# Patient Record
Sex: Female | Born: 1995 | Race: White | Hispanic: No | Marital: Single | Smoking: Former smoker
Health system: Southern US, Community
[De-identification: ages and names within clinical notes are randomized; demographics above are authoritative.]

## PROBLEM LIST (undated history)

## (undated) HISTORY — PX: OTHER SURGICAL HISTORY: SHX169

---

## 2021-04-29 ENCOUNTER — Emergency Department: Payer: No Typology Code available for payment source

## 2021-04-29 ENCOUNTER — Emergency Department
Admission: EM | Admit: 2021-04-29 | Discharge: 2021-04-29 | Disposition: A | Discharge status: Home / Self Care | Payer: No Typology Code available for payment source | Attending: Emergency Medicine | Admitting: Emergency Medicine

## 2021-04-29 ENCOUNTER — Other Ambulatory Visit: Payer: Self-pay

## 2021-04-29 DIAGNOSIS — O200 Threatened abortion: Secondary | ICD-10-CM | POA: Insufficient documentation

## 2021-04-29 DIAGNOSIS — Z87891 Personal history of nicotine dependence: Secondary | ICD-10-CM | POA: Diagnosis not present

## 2021-04-29 DIAGNOSIS — M7918 Myalgia, other site: Secondary | ICD-10-CM

## 2021-04-29 DIAGNOSIS — M25511 Pain in right shoulder: Secondary | ICD-10-CM | POA: Insufficient documentation

## 2021-04-29 DIAGNOSIS — Z9181 History of falling: Secondary | ICD-10-CM

## 2021-04-29 DIAGNOSIS — Z3491 Encounter for supervision of normal pregnancy, unspecified, first trimester: Secondary | ICD-10-CM

## 2021-04-29 DIAGNOSIS — M542 Cervicalgia: Secondary | ICD-10-CM | POA: Diagnosis not present

## 2021-04-29 DIAGNOSIS — W208XXA Other cause of strike by thrown, projected or falling object, initial encounter: Secondary | ICD-10-CM | POA: Diagnosis not present

## 2021-04-29 DIAGNOSIS — Z3A01 Less than 8 weeks gestation of pregnancy: Secondary | ICD-10-CM | POA: Insufficient documentation

## 2021-04-29 DIAGNOSIS — R109 Unspecified abdominal pain: Secondary | ICD-10-CM

## 2021-04-29 LAB — HCG, QUANTITATIVE, PREGNANCY: hCG, Beta Chain, Quant, S: 28479 m[IU]/mL — ABNORMAL HIGH (ref <5)

## 2021-04-29 NOTE — ED Triage Notes (Signed)
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 Pt c/o neck pain . Pt states she is also [redacted] weeks pregnant.

## 2021-04-29 NOTE — ED Provider Notes (Signed)
Early pregnancy  [facility] Emergency Department Provider Note   ____________________________________________   Event Date/Time   First MD Initiated Contact with Patient 04/29/21 [phone]     (approximate)  I have reviewed the triage vital signs and the nursing notes.   HISTORY  Chief Complaint Neck Pain    HPI  Carmen Yoder is a 25 y.o. female patient complain of posterior neck and right shoulder pain secondary to being hit by a cargo door causing her to fall.  Patient also was concerned due to early pregnancy.  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  Patient denies pelvic pain or active bleeding at this time.  Patient rates her pain as a 5/10.  Patient described pain to her neck and shoulder as "achy".  No palliative measure for complaint.  Patient states she is approximate [redacted] weeks gestation.         History reviewed. No pertinent past medical history.  There are no problems to display for this patient.   Past Surgical History:  Procedure Laterality Date   lypoma removal      Prior to Admission medications   Not on File    Allergies Patient has no known allergies.  No family history on file.  Social History Social History   Tobacco Use   Smoking status: Former    Pack years: 0.00    Types: Cigarettes   Smokeless tobacco: Never  Substance Use Topics   Alcohol use: Not Currently   Drug use: Not Currently    Review of Systems Constitutional: No fever/chills Eyes: No visual changes. ENT: No sore throat. Cardiovascular: Denies chest pain. Respiratory: Denies shortness of breath. Gastrointestinal: No abdominal pain.  No nausea, no vomiting.  No diarrhea.  No constipation. Genitourinary: Negative for dysuria. Musculoskeletal: Neck and right shoulder pain.   Skin: Negative for rash. Neurological: Negative for headaches, focal weakness or numbness.   ____________________________________________   PHYSICAL EXAM:  VITAL  SIGNS: ED Triage Vitals  Enc Vitals Group     BP 04/29/21 0832 113/62     Pulse Rate 04/29/21 0832 (!) 59     Resp 04/29/21 0832 17     Temp 04/29/21 0832 98.7 F (37.1 C)     Temp Source 04/29/21 0832 Oral     SpO2 04/29/21 0832 98 %     Weight 04/29/21 0832 164 lb (74.4 kg)     Height 04/29/21 0832 4\' 11"  (1.499 m)     Head Circumference --      Peak Flow --      Pain Score 04/29/21 0832 5     Pain Loc --      Pain Edu? --      Excl. in GC? --     Constitutional: Alert and oriented. Well appearing and in no acute distress. Eyes: Conjunctivae are normal. PERRL. EOMI. Head: Atraumatic. Nose: No congestion/rhinnorhea. Mouth/Throat: Mucous membranes are moist.  Oropharynx non-erythematous. Neck: No stridor.  Mild cervical spine tenderness to palpation. Cardiovascular: Normal rate, regular rhythm. Grossly normal heart sounds.  Good peripheral circulation. Respiratory: Normal respiratory effort.  No retractions. Lungs CTAB. Gastrointestinal: Soft and nontender. No distention. No abdominal bruits. No CVA tenderness. Genitourinary: Deferred Musculoskeletal: No lower extremity tenderness nor edema.  No joint effusions. Neurologic:  Normal speech and language. No gross focal neurologic deficits are appreciated. No gait instability. Skin:  Skin is warm, dry and intact. No rash noted.  Abrasion right shoulder. Psychiatric: Mood and affect are normal. Speech and behavior  are normal.  ____________________________________________   LABS (all labs ordered are listed, but only abnormal results are displayed)  Labs Reviewed  HCG, QUANTITATIVE, PREGNANCY - Abnormal; Notable for the following components:      Result Value   hCG, Beta Chain, Quant, S 28,479 (*)    All other components within normal limits   ____________________________________________  EKG   ____________________________________________  RADIOLOGY I, Joni Reining, personally viewed and evaluated these images  (plain radiographs) as part of my medical decision making, as well as reviewing the written report by the radiologist.  ED MD interpretation:    Official radiology report(s): DG Cervical Spine 2-3 Views  Result Date: 04/29/2021 CLINICAL DATA:  Neck trauma. EXAM: CERVICAL SPINE - 2-3 VIEW COMPARISON:  None. FINDINGS: There is no evidence of cervical spine fracture or prevertebral soft tissue swelling. Alignment is normal. No other significant bone abnormalities are identified. IMPRESSION: Negative cervical spine radiographs. Electronically Signed   By: Ted Mcalpine M.D.   On: 04/29/2021 09:31   US OB LESS THAN 14 WEEKS WITH OB TRANSVAGINAL  Result Date: 04/29/2021 CLINICAL DATA:  Pelvic pain.  Recent fall EXAM: OBSTETRIC <14 WK [place] AND TRANSVAGINAL OB US TECHNIQUE: Both transabdominal and transvaginal ultrasound examinations were performed for complete evaluation of the gestation as well as the maternal uterus, adnexal regions, and pelvic cul-de-sac. Transvaginal technique was performed to assess early pregnancy. COMPARISON:  None. FINDINGS: Intrauterine gestational sac: Visualized-single Yolk sac:  Visualized Embryo:  Visualized Cardiac Activity: Not visualized CRL:  5 mm   6 w   2 d Subchorionic hemorrhage:  5 x 2 mm subchorionic hemorrhage. Maternal uterus/adnexae: Cervical os closed. Right ovary measures 2.8 x 1.6 x 1.5 cm. Left ovary measures 2.7 x 2.5 by 2.5 cm. No extrauterine pelvic mass. No free pelvic fluid. IMPRESSION: 1. Apparent fetal pole measuring 5 mm in length. No fetal cardiac activity noted. Findings are suspicious but not yet definitive for failed pregnancy. Recommend follow-up US in 10-14 days for definitive diagnosis. This recommendation follows SRU consensus guidelines: Diagnostic Criteria for Nonviable Pregnancy Early in the First Trimester. Malva Limes Med 2013; 191:4782-95. 2.  Subcentimeter subchorionic hemorrhage. 3.  No extrauterine pelvic mass or fluid. Electronically  Signed   By: Bretta Bang III M.D.   On: 04/29/2021 11:32    ____________________________________________   PROCEDURES  Procedure(s) performed (including Critical Care):  Procedures   ____________________________________________   INITIAL IMPRESSION / ASSESSMENT AND PLAN / ED COURSE  As part of my medical decision making, I reviewed the following data within the electronic MEDICAL RECORD NUMBER         Patient presents for neck and shoulder pain secondary to contusion.  Patient also was concerned due to early pregnancy.  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  Patient denies abdominal or pelvic pain.  Discussed acute findings on x-ray of the cervical spine.  Discussed ultrasound findings with patient which is suspicious for failed pregnancy.  Advised patient needs to have her ultrasound repeated in 10 to 14 days for definitive diagnosis.  Return to ED if condition worsens.  ____________________________________________   FINAL CLINICAL IMPRESSION(S) / ED DIAGNOSES  Final diagnoses:  Musculoskeletal pain  Threatened abortion in early pregnancy     ED Discharge Orders     None        Note:  This document was prepared using Dragon voice recognition software and may include unintentional dictation errors.    Joni Reining, PA-C 04/29/21 1147  Sharman Cheek, MD 04/30/21 2326

## 2021-04-29 NOTE — Discharge Instructions (Addendum)
Read and follow discharge care instructions.  Advised only extra strength Tylenol at this time.  Advised to have ultrasound repeated in 10 to 14 days with dependent diagnosis.

## 2021-04-29 NOTE — ED Notes (Addendum)
See triage note  States she was holding open a cargo door on a bus  States it did not latch  Fell hitting neck and shoulder  Red area noted to neck    No LOC  Pt is [redacted] weeks pregnant and want to make sure the pregnancy is ok  No vaginal bleeding  But states she was knocked down and drag slightly

## 2021-04-29 NOTE — ED Notes (Signed)
Per work office, pt stated, "no test needed." No WC needed for [employer]. Bonita Quin, RN aware.

## 2021-04-29 NOTE — ED Notes (Signed)
First Nurse Note: Pt to ED via [facility], pt reports that a door fell and hit her in the neck. Pt has red mark on the back right side of her neck, decreased ROM, pt fell onto the left side, no LOC. Pt is [redacted] weeks pregnant. Pt is having some discomfort in the left side where she fell. Pt ambulatory on scene and in lobby. VSS pt is in NAD.

## 2022-12-17 IMAGING — US US OB < 14 WEEKS - US OB TV
1 series · 13 of 28 positions shown · non-contrast
Comparison: None.

CLINICAL DATA: Pelvic pain.  Recent fall

EXAM:
OBSTETRIC <14 WK US AND TRANSVAGINAL OB US
TECHNIQUE: Both transabdominal and transvaginal ultrasound examinations were
performed for complete evaluation of the gestation as well as the
maternal uterus, adnexal regions, and pelvic cul-de-sac.
Transvaginal technique was performed to assess early pregnancy.

[Series 1: us ob < 14 weeks - us ob tv · 0.09mm/px · 13 of 115 slices shown]
[im 5/115]
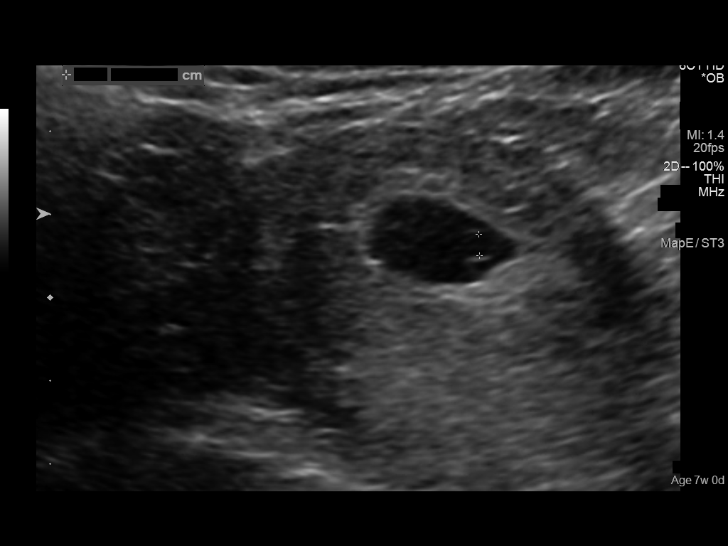
[im 13/115]
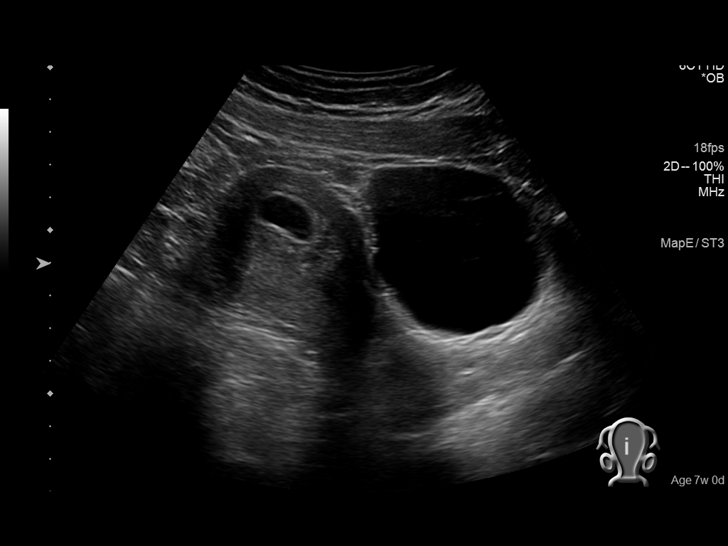
[im 22/115]
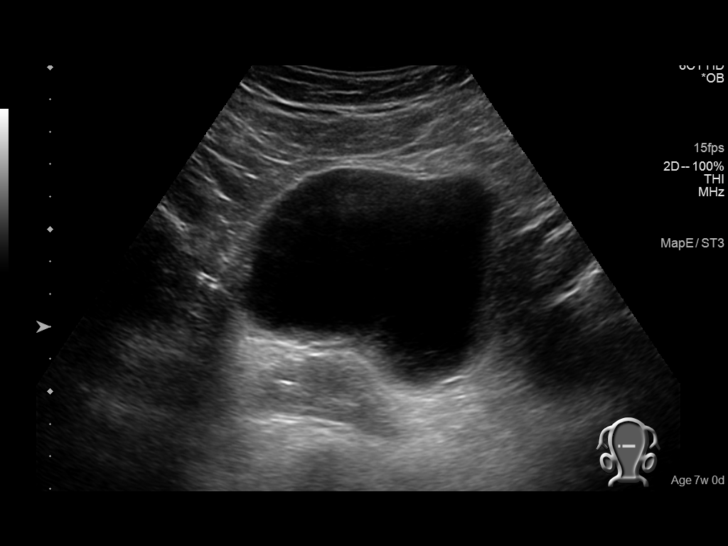
[im 30/115]
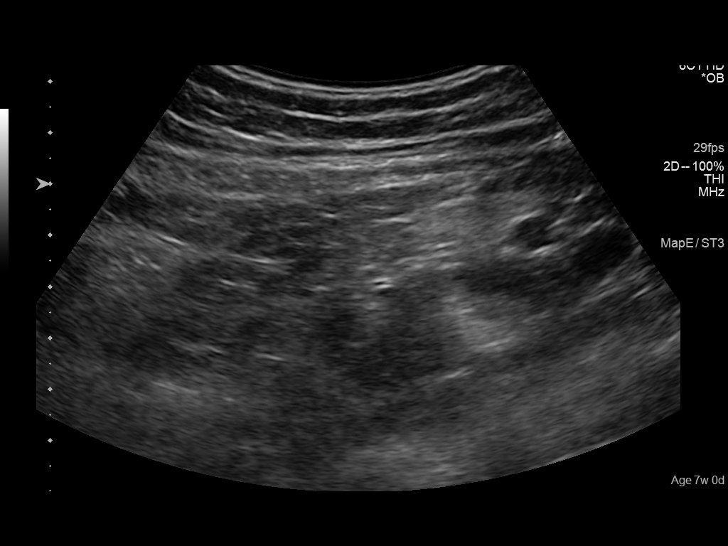
[im 39/115]
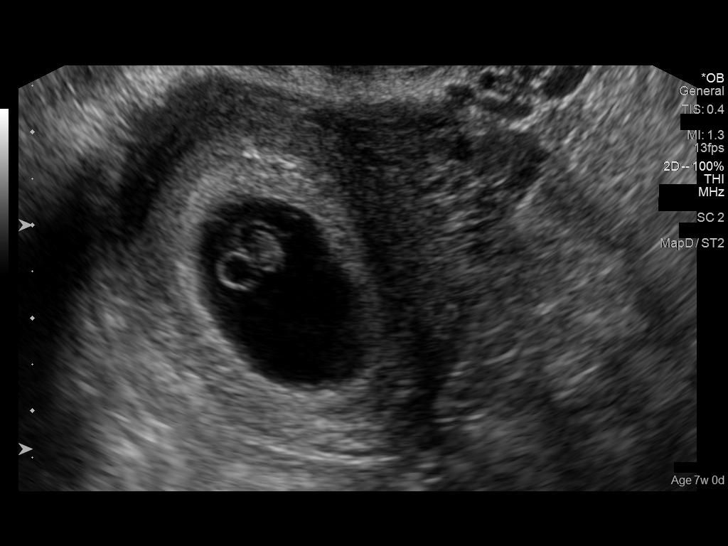
[im 47/115]
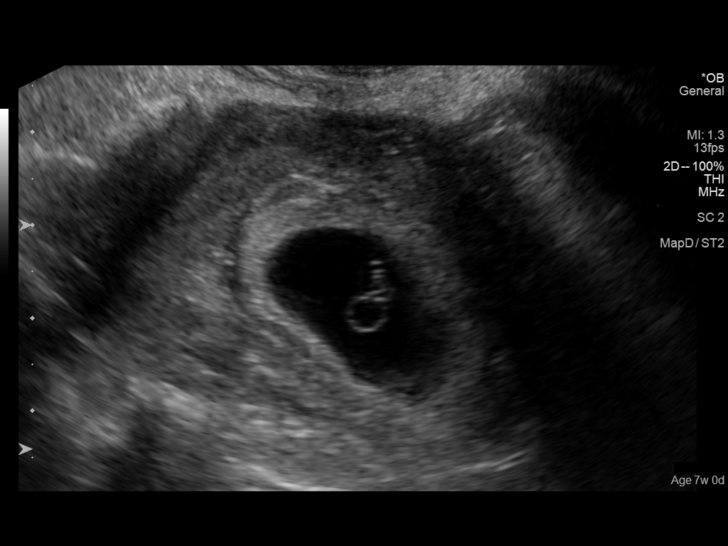
[im 60/115]
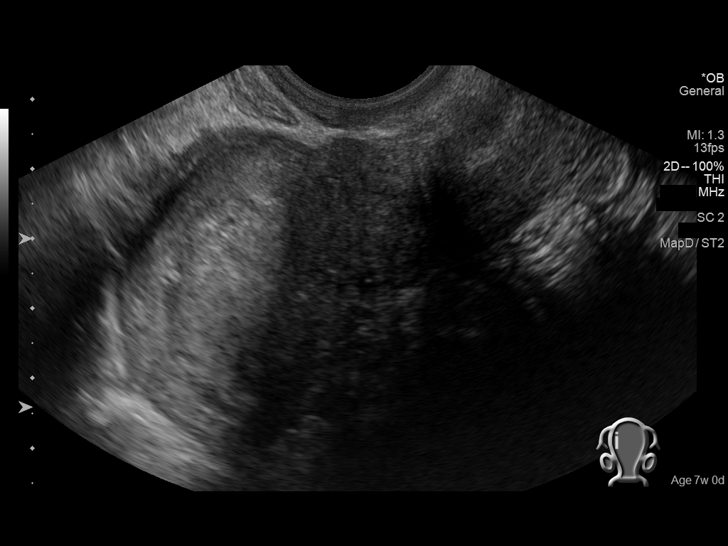
[im 68/115]
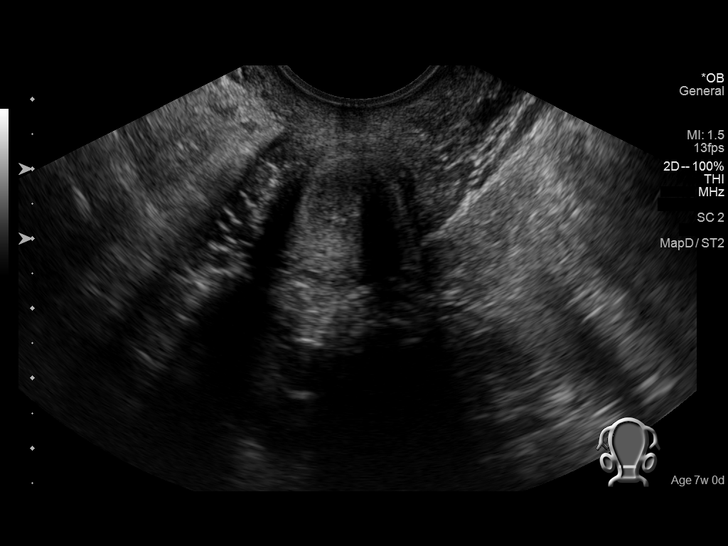
[im 77/115]
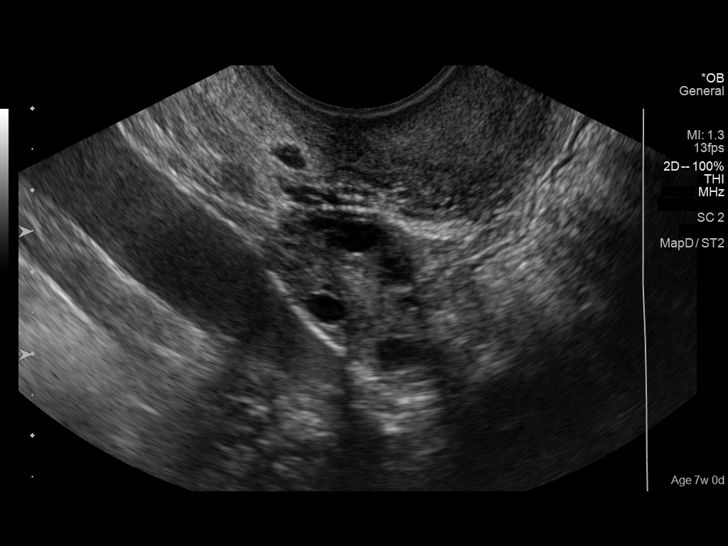
[im 85/115]
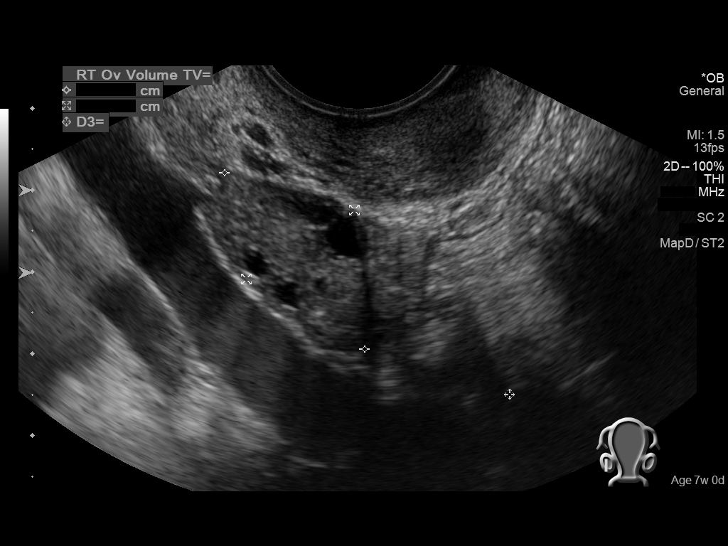
[im 93/115]
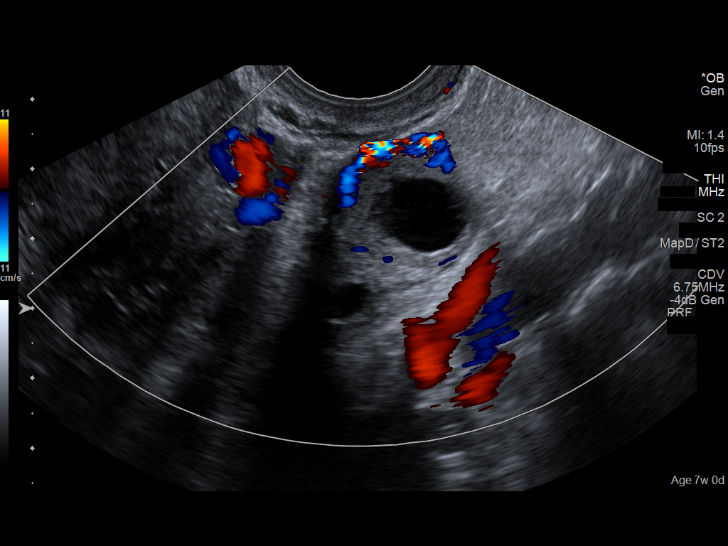
[im 102/115]
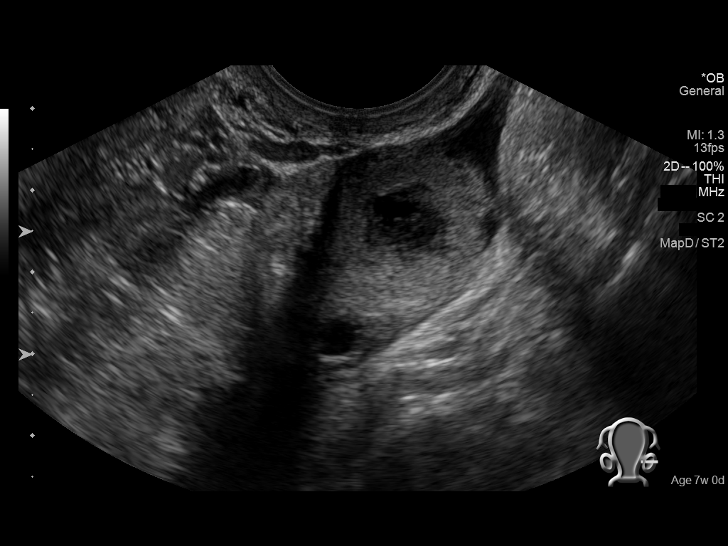
[im 110/115]
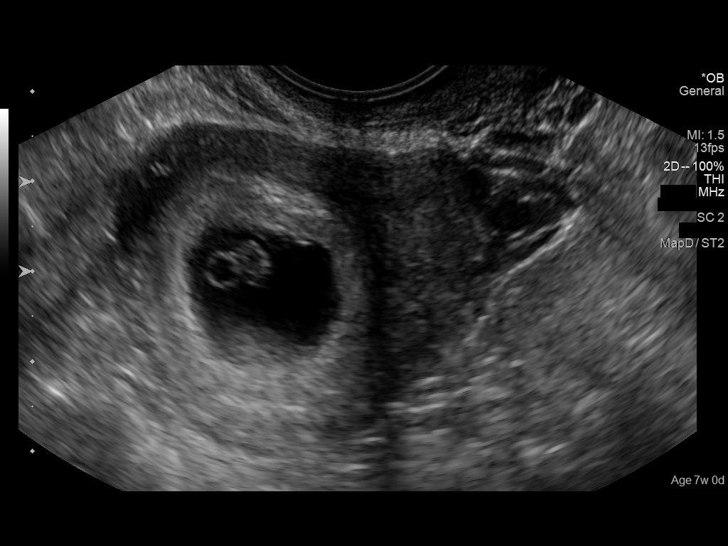

[13 of 28 positions shown; findings below may reference images not displayed]

FINDINGS: Intrauterine gestational sac: Visualized-single

Yolk sac:  Visualized

Embryo:  Visualized

Cardiac Activity: Not visualized

CRL:  5 mm   6 w   2 d

Subchorionic hemorrhage:  5 x 2 mm subchorionic hemorrhage.

Maternal uterus/adnexae: Cervical os closed. Right ovary measures
2.8 x 1.6 x 1.5 cm. Left ovary measures 2.7 x 2.5 by 2.5 cm. No
extrauterine pelvic mass. No free pelvic fluid.
IMPRESSION: 1. Apparent fetal pole measuring 5 mm in length. No fetal cardiac
activity noted. Findings are suspicious but not yet definitive for
failed pregnancy. Recommend follow-up US in 10-14 days for
definitive diagnosis. This recommendation follows SRU consensus
guidelines: Diagnostic Criteria for Nonviable Pregnancy Early in the
First Trimester. N Engl J Med 3411; [DATE].

2.  Subcentimeter subchorionic hemorrhage.

3.  No extrauterine pelvic mass or fluid.

## 2024-08-15 DEATH — deceased
